# Patient Record
Sex: Female | Born: 1970 | Race: White | Hispanic: No | Smoking: Never smoker
Health system: Southern US, Community
[De-identification: ages and names within clinical notes are randomized; demographics above are authoritative.]

---

## 1999-03-11 ENCOUNTER — Other Ambulatory Visit: Admission: RE | Admit: 1999-03-11 | Discharge: 1999-03-11 | Payer: Self-pay | Admitting: Obstetrics and Gynecology

## 1999-08-01 ENCOUNTER — Inpatient Hospital Stay (HOSPITAL_COMMUNITY): Admission: AD | Admit: 1999-08-01 | Discharge: 1999-08-01 | Payer: Self-pay | Admitting: Obstetrics and Gynecology

## 1999-08-24 ENCOUNTER — Encounter: Payer: Self-pay | Admitting: Obstetrics and Gynecology

## 1999-08-24 ENCOUNTER — Inpatient Hospital Stay (HOSPITAL_COMMUNITY): Admission: AD | Admit: 1999-08-24 | Discharge: 1999-08-24 | Payer: Self-pay | Admitting: Obstetrics and Gynecology

## 1999-09-16 ENCOUNTER — Inpatient Hospital Stay (HOSPITAL_COMMUNITY): Admission: AD | Admit: 1999-09-16 | Discharge: 1999-09-16 | Payer: Self-pay | Admitting: Obstetrics and Gynecology

## 1999-09-26 ENCOUNTER — Inpatient Hospital Stay (HOSPITAL_COMMUNITY): Admission: AD | Admit: 1999-09-26 | Discharge: 1999-09-26 | Payer: Self-pay | Admitting: Obstetrics & Gynecology

## 1999-09-29 ENCOUNTER — Inpatient Hospital Stay (HOSPITAL_COMMUNITY): Admission: AD | Admit: 1999-09-29 | Discharge: 1999-09-29 | Payer: Self-pay | Admitting: *Deleted

## 1999-10-02 ENCOUNTER — Inpatient Hospital Stay (HOSPITAL_COMMUNITY): Admission: AD | Admit: 1999-10-02 | Discharge: 1999-10-03 | Payer: Self-pay | Admitting: Obstetrics and Gynecology

## 2001-04-10 ENCOUNTER — Encounter: Payer: Self-pay | Admitting: *Deleted

## 2001-04-10 ENCOUNTER — Emergency Department (HOSPITAL_COMMUNITY): Admission: EM | Admit: 2001-04-10 | Discharge: 2001-04-10 | Payer: Self-pay | Admitting: *Deleted

## 2001-04-20 ENCOUNTER — Emergency Department (HOSPITAL_COMMUNITY): Admission: EM | Admit: 2001-04-20 | Discharge: 2001-04-20 | Payer: Self-pay | Admitting: Emergency Medicine

## 2001-11-18 ENCOUNTER — Inpatient Hospital Stay (HOSPITAL_COMMUNITY): Admission: AD | Admit: 2001-11-18 | Discharge: 2001-11-18 | Payer: Self-pay | Admitting: Obstetrics and Gynecology

## 2001-12-28 ENCOUNTER — Inpatient Hospital Stay (HOSPITAL_COMMUNITY): Admission: AD | Admit: 2001-12-28 | Discharge: 2001-12-28 | Payer: Self-pay | Admitting: *Deleted

## 2001-12-30 ENCOUNTER — Inpatient Hospital Stay (HOSPITAL_COMMUNITY): Admission: AD | Admit: 2001-12-30 | Discharge: 2002-01-01 | Payer: Self-pay | Admitting: Obstetrics and Gynecology

## 2002-02-06 ENCOUNTER — Other Ambulatory Visit: Admission: RE | Admit: 2002-02-06 | Discharge: 2002-02-06 | Payer: Self-pay | Admitting: Obstetrics and Gynecology

## 2002-07-03 ENCOUNTER — Encounter: Payer: Self-pay | Admitting: Obstetrics and Gynecology

## 2002-07-03 ENCOUNTER — Inpatient Hospital Stay (HOSPITAL_COMMUNITY): Admission: AD | Admit: 2002-07-03 | Discharge: 2002-07-03 | Payer: Self-pay | Admitting: Obstetrics and Gynecology

## 2002-07-04 ENCOUNTER — Emergency Department (HOSPITAL_COMMUNITY): Admission: EM | Admit: 2002-07-04 | Discharge: 2002-07-04 | Payer: Self-pay | Admitting: Internal Medicine

## 2002-09-08 ENCOUNTER — Inpatient Hospital Stay (HOSPITAL_COMMUNITY): Admission: AD | Admit: 2002-09-08 | Discharge: 2002-09-08 | Payer: Self-pay | Admitting: Obstetrics and Gynecology

## 2002-09-16 ENCOUNTER — Encounter: Admission: RE | Admit: 2002-09-16 | Discharge: 2002-10-06 | Payer: Self-pay | Admitting: Obstetrics and Gynecology

## 2002-11-15 ENCOUNTER — Inpatient Hospital Stay (HOSPITAL_COMMUNITY): Admission: AD | Admit: 2002-11-15 | Discharge: 2002-11-17 | Payer: Self-pay | Admitting: Obstetrics and Gynecology

## 2002-12-24 ENCOUNTER — Other Ambulatory Visit: Admission: RE | Admit: 2002-12-24 | Discharge: 2002-12-24 | Payer: Self-pay | Admitting: Obstetrics and Gynecology

## 2003-05-12 ENCOUNTER — Ambulatory Visit (HOSPITAL_COMMUNITY): Admission: RE | Admit: 2003-05-12 | Discharge: 2003-05-12 | Payer: Self-pay | Admitting: Preventative Medicine

## 2003-06-25 ENCOUNTER — Emergency Department (HOSPITAL_COMMUNITY): Admission: EM | Admit: 2003-06-25 | Discharge: 2003-06-25 | Payer: Self-pay | Admitting: Emergency Medicine

## 2003-10-15 ENCOUNTER — Other Ambulatory Visit: Admission: RE | Admit: 2003-10-15 | Discharge: 2003-10-15 | Payer: Self-pay | Admitting: Obstetrics and Gynecology

## 2004-04-21 ENCOUNTER — Inpatient Hospital Stay (HOSPITAL_COMMUNITY): Admission: AD | Admit: 2004-04-21 | Discharge: 2004-04-23 | Payer: Self-pay | Admitting: Obstetrics and Gynecology

## 2004-09-28 ENCOUNTER — Emergency Department (HOSPITAL_COMMUNITY): Admission: EM | Admit: 2004-09-28 | Discharge: 2004-09-28 | Payer: Self-pay | Admitting: Emergency Medicine

## 2005-03-09 ENCOUNTER — Emergency Department (HOSPITAL_COMMUNITY): Admission: EM | Admit: 2005-03-09 | Discharge: 2005-03-09 | Payer: Self-pay | Admitting: Emergency Medicine

## 2008-01-20 ENCOUNTER — Emergency Department (HOSPITAL_COMMUNITY): Admission: EM | Admit: 2008-01-20 | Discharge: 2008-01-20 | Payer: Self-pay | Admitting: Emergency Medicine

## 2008-03-21 ENCOUNTER — Inpatient Hospital Stay (HOSPITAL_COMMUNITY): Admission: AD | Admit: 2008-03-21 | Discharge: 2008-03-21 | Payer: Self-pay | Admitting: Obstetrics

## 2008-05-10 ENCOUNTER — Inpatient Hospital Stay (HOSPITAL_COMMUNITY): Admission: AD | Admit: 2008-05-10 | Discharge: 2008-05-10 | Payer: Self-pay | Admitting: Obstetrics & Gynecology

## 2008-09-28 ENCOUNTER — Inpatient Hospital Stay (HOSPITAL_COMMUNITY): Admission: RE | Admit: 2008-09-28 | Discharge: 2008-09-30 | Payer: Self-pay | Admitting: Obstetrics and Gynecology

## 2009-01-29 ENCOUNTER — Emergency Department (HOSPITAL_COMMUNITY): Admission: EM | Admit: 2009-01-29 | Discharge: 2009-01-30 | Payer: Self-pay | Admitting: Emergency Medicine

## 2009-02-02 ENCOUNTER — Emergency Department (HOSPITAL_COMMUNITY): Admission: EM | Admit: 2009-02-02 | Discharge: 2009-02-02 | Payer: Self-pay | Admitting: Emergency Medicine

## 2009-02-16 ENCOUNTER — Emergency Department (HOSPITAL_COMMUNITY): Admission: EM | Admit: 2009-02-16 | Discharge: 2009-02-16 | Payer: Self-pay | Admitting: Emergency Medicine

## 2009-03-09 ENCOUNTER — Ambulatory Visit (HOSPITAL_COMMUNITY): Admission: RE | Admit: 2009-03-09 | Discharge: 2009-03-09 | Payer: Self-pay | Admitting: Obstetrics and Gynecology

## 2009-09-09 ENCOUNTER — Emergency Department (HOSPITAL_COMMUNITY): Admission: EM | Admit: 2009-09-09 | Discharge: 2009-09-09 | Payer: Self-pay | Admitting: Family Medicine

## 2010-03-25 ENCOUNTER — Encounter: Admission: RE | Admit: 2010-03-25 | Discharge: 2010-03-25 | Payer: Self-pay | Admitting: *Deleted

## 2010-08-24 LAB — POCT URINALYSIS DIP (DEVICE)
Glucose, UA: NEGATIVE mg/dL
Protein, ur: NEGATIVE mg/dL
Specific Gravity, Urine: 1.025 (ref 1.005–1.030)
Urobilinogen, UA: 1 mg/dL (ref 0.0–1.0)

## 2010-09-14 LAB — CBC
HCT: 29.9 % — ABNORMAL LOW (ref 36.0–46.0)
Hemoglobin: 10.4 g/dL — ABNORMAL LOW (ref 12.0–15.0)
MCV: 85.5 fL (ref 78.0–100.0)
Platelets: 165 10*3/uL (ref 150–400)
RBC: 3.51 MIL/uL — ABNORMAL LOW (ref 3.87–5.11)
RBC: 3.94 MIL/uL (ref 3.87–5.11)
RDW: 15 % (ref 11.5–15.5)
WBC: 11.6 10*3/uL — ABNORMAL HIGH (ref 4.0–10.5)

## 2010-09-14 LAB — RPR: RPR Ser Ql: NONREACTIVE

## 2010-10-18 NOTE — H&P (Signed)
[name], [name]                 ACCOUNT NO.:  [id number]   MEDICAL RECORD NO.:  [id number]          PATIENT TYPE:  INP   LOCATION:  9112                          FACILITY:  [facility]   PHYSICIAN:  Lenoard Aden, M.D.DATE OF BIRTH:  1970/11/05   DATE OF ADMISSION:  09/28/2008  DATE OF DISCHARGE:                              HISTORY & PHYSICAL   INDICATION FOR INDUCTION:  Advanced dilatation, history of precipitous  labor for control-attempted induction at 39 weeks.  She is a 40 year old  white female, G6, P5, who presents at 39 weeks for elective induction of  labor due to advanced dilatation and history of precipitous labor.   She is a nonsmoker, nondrinker.  She denies domestic physical violence.   She has no known drug allergies.   Medications include prenatal vitamins.   She has a history of 5 uncomplicated vaginal deliveries.   She has a family history of heart disease, hypertension, COPD, diabetes,  depression, and schizophrenia.   Social history is noncontributory.  Previous history of gestational  diabetes and pregnancy.   PHYSICAL EXAMINATION:  GENERAL:  She is a well-developed, well-nourished  white female in no acute distress.  HEENT:  Normal.  LUNGS:  Clear.  HEART:  Regular.  ABDOMEN:  Soft, gravid, nontender.  Estimated fetal weight 8 pounds.  Cervix is 4-5 cm vertex, 70%, -1.  EXTREMITIES:  No cords.  NEUROLOGIC:  Nonfocal.  SKIN:  Intact.   IMPRESSION:  1. 9-week obstetrics.  2. History of precipitous labor.   PLAN:  Proceed with control-attempted induction, epidural, anticipate  attempts at vaginal delivery.      Lenoard Aden, M.D.  Electronically Signed    RJT/MEDQ  D:  09/28/2008  T:  09/29/2008  Job:  [id number]

## 2010-10-21 NOTE — Consult Note (Signed)
   NAME:  [name], [name]                           ACCOUNT NO.:  [id number]   MEDICAL RECORD NO.:  [id number]                   PATIENT TYPE:  MAT   LOCATION:  [facility]                                 FACILITY:  [facility]   PHYSICIAN:  Lenoard Aden, M.D.             DATE OF BIRTH:  [birth year]   DATE OF CONSULTATION:  09/08/2002  DATE OF DISCHARGE:                                   CONSULTATION   EMERGENCY ROOM CONSULTATION   CHIEF COMPLAINT:  Rule out labor.   HISTORY OF PRESENT ILLNESS:  This patient is a 40 year old white female G5  P3; EDD of November 27, 2002; currently at 26 and three-sevenths weeks who  presents to rule out contractions.   MEDICATIONS:  Prenatal vitamins.   HISTORY:  Three uncomplicated term obstetric deliveries and one  uncomplicated TAB.  No other medical or surgical hospitalizations.  History  of preterm labor with term delivery x3.   FAMILY HISTORY:  Insulin-dependent diabetes, congestive heart failure,  hypertension.   PRENATAL LABORATORY DATA:  Reveals a blood type of O negative.  Rubella  immune.  Hepatitis B and HIV negative.   PHYSICAL EXAMINATION:  GENERAL:  The patient is a comfortable-appearing  white female in no apparent distress.  HEENT:  Normal.  LUNGS:  Clear.  HEART:  Regular rhythm.  ABDOMEN:  Soft, gravid, nontender.  PELVIC:  Cervix is closed, thick, and high.  EXTREMITIES AND NEUROLOGIC:  Nonfocal.   IMPRESSION:  Twenty-eight week intrauterine pregnancy, no evidence of  preterm labor.  NST reassuring.  Fetal heart tones in the 140 beat per  minute range, accelerations, no decelerations noted.   PLAN:  1. Discharge home.  2. Follow up in the office in one day.  3. Will do fetal fibronectin at next visit.                                               Lenoard Aden, M.D.    RJT/MEDQ  D:  09/08/2002  T:  09/08/2002  Job:  [id number]

## 2011-03-10 LAB — URINALYSIS, ROUTINE W REFLEX MICROSCOPIC
Bilirubin Urine: NEGATIVE
Glucose, UA: NEGATIVE mg/dL
Hgb urine dipstick: NEGATIVE
Specific Gravity, Urine: <1.005 — ABNORMAL LOW (ref 1.005–1.030)
Urobilinogen, UA: 0.2 mg/dL (ref 0.0–1.0)
pH: 6.5 (ref 5.0–8.0)

## 2014-10-19 ENCOUNTER — Other Ambulatory Visit: Payer: Self-pay

## 2014-10-19 DIAGNOSIS — Z1231 Encounter for screening mammogram for malignant neoplasm of breast: Secondary | ICD-10-CM

## 2014-10-22 ENCOUNTER — Ambulatory Visit
Admission: RE | Admit: 2014-10-22 | Discharge: 2014-10-22 | Disposition: A | Discharge status: Home / Self Care | Payer: 59 | Source: Ambulatory Visit

## 2014-10-22 DIAGNOSIS — Z1231 Encounter for screening mammogram for malignant neoplasm of breast: Secondary | ICD-10-CM

## 2014-11-16 ENCOUNTER — Emergency Department (INDEPENDENT_AMBULATORY_CARE_PROVIDER_SITE_OTHER): Payer: 59

## 2014-11-16 ENCOUNTER — Encounter (HOSPITAL_COMMUNITY): Payer: Self-pay | Admitting: Emergency Medicine

## 2014-11-16 ENCOUNTER — Emergency Department (INDEPENDENT_AMBULATORY_CARE_PROVIDER_SITE_OTHER)
Admission: EM | Admit: 2014-11-16 | Discharge: 2014-11-16 | Disposition: A | Discharge status: Home / Self Care | Payer: 59 | Source: Home / Self Care | Attending: Family Medicine | Admitting: Family Medicine

## 2014-11-16 DIAGNOSIS — M25531 Pain in right wrist: Secondary | ICD-10-CM

## 2014-11-16 NOTE — ED Provider Notes (Signed)
[name] is a 44 y.o. female who presents to Urgent Care today for right wrist pain. Patient was lifting a table from the back of her car when the table slid twisting her right wrist. She felt a pop and pain on the ulnar aspect of her right wrist. The pain has persisted today and is worse with activity. Aleve and a wrist wrap which helped a bit. She feels well otherwise with no radiating pain weakness or numbness. No fall or direct impact trauma.   History reviewed. No pertinent past medical history. History reviewed. No pertinent past surgical history. History  Substance Use Topics  . Smoking status: Never Smoker   . Smokeless tobacco: Not on file  . Alcohol Use: No   ROS as above Medications: No current facility-administered medications for this encounter.   No current outpatient prescriptions on file.   No Known Allergies   Exam:  BP 126/79 mmHg  Pulse 81  Temp(Src) 98.8 F (37.1 C) (Oral)  Resp 16  SpO2 95%  LMP 11/12/2014 Gen: Well NAD HEENT: EOMI,  MMM Right arm: Shoulder nontender normal motion Elbow normal-appearing nontender normal motion Wrist tender palpation along the ulnar wrist dorsal and volar. Pain with flexion and ulnar deviation of the wrist and extension and ulnar deviation of the wrist. It is nontender in the anatomical snuff box. Pulses capillary refill and sensation are intact distally Exts: Brisk capillary refill, warm and well perfused.   No results found for this or any previous visit (from the past 24 hour(s)). 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 Lateral right wrist pain. Initial encounter.  EXAM: RIGHT WRIST - COMPLETE 3+ VIEW  COMPARISON:  None.  FINDINGS: No acute bony or joint abnormality is identified. Mild ulnar minus variance is noted. Soft tissue structures are unremarkable.  IMPRESSION: No acute abnormality.   Electronically Signed   By: Drusilla Kanner M.D.   On: 11/16/2014 17:07    Assessment and Plan: 44 y.o. female with wrist pain and concern for strain or injury to the flexor carpi ulnaris tendon or injury to the TFCC.  Discussed options. Treat with thumb spica splint and NSAIDs. Patient declines prednisone dose pack. Follow-up with hand surgery if not getting better.  Discussed warning signs or symptoms. Please see discharge instructions. Patient expresses understanding.     Rodolph Bong, MD 11/16/14 1725

## 2014-11-16 NOTE — Discharge Instructions (Signed)
Thank you for coming in today. Take up to 2 Aleve twice daily for pain. Use the thumb spica splint. Follow-up with hand surgery if not getting better.  Wrist Sprain with Rehab A sprain is an injury in which a ligament that maintains the proper alignment of a joint is partially or completely torn. The ligaments of the wrist are susceptible to sprains. Sprains are classified into three categories. Grade 1 sprains cause pain, but the tendon is not lengthened. Grade 2 sprains include a lengthened ligament because the ligament is stretched or partially ruptured. With grade 2 sprains there is still function, although the function may be diminished. Grade 3 sprains are characterized by a complete tear of the tendon or muscle, and function is usually impaired. SYMPTOMS   Pain tenderness, inflammation, and/or bruising (contusion) of the injury.  A "pop" or tear felt and/or heard at the time of injury.  Decreased wrist function. CAUSES  A wrist sprain occurs when a force is placed on one or more ligaments that is greater than it/they can withstand. Common mechanisms of injury include:  Catching a ball with you hands.  Repetitive and/ or strenuous extension or flexion of the wrist. RISK INCREASES WITH:  Previous wrist injury.  Contact sports (boxing or wrestling).  Activities in which falling is common.  Poor strength and flexibility.  Improperly fitted or padded protective equipment. PREVENTION  Warm up and stretch properly before activity.  Allow for adequate recovery between workouts.  Maintain physical fitness:  Strength, flexibility, and endurance.  Cardiovascular fitness.  Protect the wrist joint by limiting its motion with the use of taping, braces, or splints.  Protect the wrist after injury for 6 to 12 months. PROGNOSIS  The prognosis for wrist sprains depends on the degree of injury. Grade 1 sprains require 2 to 6 weeks of treatment. Grade 2 sprains require 6 to 8 weeks  of treatment, and grade 3 sprains require up to 12 weeks.  RELATED COMPLICATIONS   Prolonged healing time, if improperly treated or re-injured.  Recurrent symptoms that result in a chronic problem.  Injury to nearby structures (bone, cartilage, nerves, or tendons).  Arthritis of the wrist.  Inability to compete in athletics at a high level.  Wrist stiffness or weakness.  Progression to a complete rupture of the ligament. TREATMENT  Treatment initially involves resting from any activities that aggravate the symptoms, and the use of ice and medications to help reduce pain and inflammation. Your caregiver may recommend immobilizing the wrist for a period of time in order to reduce stress on the ligament and allow for healing. After immobilization it is important to perform strengthening and stretching exercises to help regain strength and a full range of motion. These exercises may be completed at home or with a therapist. Surgery is not usually required for wrist sprains, unless the ligament has been ruptured (grade 3 sprain). MEDICATION   If pain medication is necessary, then nonsteroidal anti-inflammatory medications, such as aspirin and ibuprofen, or other minor pain relievers, such as acetaminophen, are often recommended.  Do not take pain medication for 7 days before surgery.  Prescription pain relievers may be given if deemed necessary by your caregiver. Use only as directed and only as much as you need. HEAT AND COLD  Cold treatment (icing) relieves pain and reduces inflammation. Cold treatment should be applied for 10 to 15 minutes every 2 to 3 hours for inflammation and pain and immediately after any activity that aggravates your symptoms. Use ice  packs or massage the area with a piece of ice (ice massage).  Heat treatment may be used prior to performing the stretching and strengthening activities prescribed by your caregiver, physical therapist, or athletic trainer. Use a heat  pack or soak your injury in warm water. SEEK MEDICAL CARE IF:  Treatment seems to offer no benefit, or the condition worsens.  Any medications produce adverse side effects. EXERCISES RANGE OF MOTION (ROM) AND STRETCHING EXERCISES - Wrist Sprain  These exercises may help you when beginning to rehabilitate your injury. Your symptoms may resolve with or without further involvement from your physician, physical therapist or athletic trainer. While completing these exercises, remember:   Restoring tissue flexibility helps normal motion to return to the joints. This allows healthier, less painful movement and activity.  An effective stretch should be held for at least 30 seconds.  A stretch should never be painful. You should only feel a gentle lengthening or release in the stretched tissue. RANGE OF MOTION - Wrist Flexion, Active-Assisted  Extend your right / left elbow with your fingers pointing down.*  Gently pull the back of your hand towards you until you feel a gentle stretch on the top of your forearm.  Hold this position for __________ seconds. Repeat __________ times. Complete this exercise __________ times per day.  *If directed by your physician, physical therapist or athletic trainer, complete this stretch with your elbow bent rather than extended. RANGE OF MOTION - Wrist Extension, Active-Assisted  Extend your right / left elbow and turn your palm upwards.*  Gently pull your palm/fingertips back so your wrist extends and your fingers point more toward the ground.  You should feel a gentle stretch on the inside of your forearm.  Hold this position for __________ seconds. Repeat __________ times. Complete this exercise __________ times per day. *If directed by your physician, physical therapist or athletic trainer, complete this stretch with your elbow bent, rather than extended. RANGE OF MOTION - Supination, Active  Stand or sit with your elbows at your side. Bend your  right / left elbow to 90 degrees.  Turn your palm upward until you feel a gentle stretch on the inside of your forearm.  Hold this position for __________ seconds. Slowly release and return to the starting position. Repeat __________ times. Complete this stretch __________ times per day.  RANGE OF MOTION - Pronation, Active  Stand or sit with your elbows at your side. Bend your right / left elbow to 90 degrees.  Turn your palm downward until you feel a gentle stretch on the top of your forearm.  Hold this position for __________ seconds. Slowly release and return to the starting position. Repeat __________ times. Complete this stretch __________ times per day.  STRETCH - Wrist Flexion  Place the back of your right / left hand on a tabletop leaving your elbow slightly bent. Your fingers should point away from your body.  Gently press the back of your hand down onto the table by straightening your elbow. You should feel a stretch on the top of your forearm.  Hold this position for __________ seconds. Repeat __________ times. Complete this stretch __________ times per day.  STRETCH - Wrist Extension  Place your right / left fingertips on a tabletop leaving your elbow slightly bent. Your fingers should point backwards.  Gently press your fingers and palm down onto the table by straightening your elbow. You should feel a stretch on the inside of your forearm.  Hold this position for  __________ seconds. Repeat __________ times. Complete this stretch __________ times per day.  STRENGTHENING EXERCISES - Wrist Sprain These exercises may help you when beginning to rehabilitate your injury. They may resolve your symptoms with or without further involvement from your physician, physical therapist or athletic trainer. While completing these exercises, remember:   Muscles can gain both the endurance and the strength needed for everyday activities through controlled exercises.  Complete these  exercises as instructed by your physician, physical therapist or athletic trainer. Progress with the resistance and repetition exercises only as your caregiver advises. STRENGTH - Wrist Flexors  Sit with your right / left forearm palm-up and fully supported. Your elbow should be resting below the height of your shoulder. Allow your wrist to extend over the edge of the surface.  Loosely holding a __________ weight or a piece of rubber exercise band/tubing, slowly curl your hand up toward your forearm.  Hold this position for __________ seconds. Slowly lower the wrist back to the starting position in a controlled manner. Repeat __________ times. Complete this exercise __________ times per day.  STRENGTH - Wrist Extensors  Sit with your right / left forearm palm-down and fully supported. Your elbow should be resting below the height of your shoulder. Allow your wrist to extend over the edge of the surface.  Loosely holding a __________ weight or a piece of rubber exercise band/tubing, slowly curl your hand up toward your forearm.  Hold this position for __________ seconds. Slowly lower the wrist back to the starting position in a controlled manner. Repeat __________ times. Complete this exercise __________ times per day.  STRENGTH - Ulnar Deviators  Stand with a ____________________ weight in your right / left hand, or sit holding on to the rubber exercise band/tubing with your opposite arm supported.  Move your wrist so that your pinkie travels toward your forearm and your thumb moves away from your forearm.  Hold this position for __________ seconds and then slowly lower the wrist back to the starting position. Repeat __________ times. Complete this exercise __________ times per day STRENGTH - Radial Deviators  Stand with a ____________________ weight in your  right / left hand, or sit holding on to the rubber exercise band/tubing with your arm supported.  Raise your hand upward in  front of you or pull up on the rubber tubing.  Hold this position for __________ seconds and then slowly lower the wrist back to the starting position. Repeat __________ times. Complete this exercise __________ times per day. STRENGTH - Forearm Supinators  Sit with your right / left forearm supported on a table, keeping your elbow below shoulder height. Rest your hand over the edge, palm down.  Gently grip a hammer or a soup ladle.  Without moving your elbow, slowly turn your palm and hand upward to a "thumbs-up" position.  Hold this position for __________ seconds. Slowly return to the starting position. Repeat __________ times. Complete this exercise __________ times per day.  STRENGTH - Forearm Pronators  Sit with your right / left forearm supported on a table, keeping your elbow below shoulder height. Rest your hand over the edge, palm up.  Gently grip a hammer or a soup ladle.  Without moving your elbow, slowly turn your palm and hand upward to a "thumbs-up" position.  Hold this position for __________ seconds. Slowly return to the starting position. Repeat __________ times. Complete this exercise __________ times per day.  STRENGTH - Grip  Grasp a tennis ball, a dense sponge, or  a large, rolled sock in your hand.  Squeeze as hard as you can without increasing any pain.  Hold this position for __________ seconds. Release your grip slowly. Repeat __________ times. Complete this exercise __________ times per day.  Document Released: 05/22/2005 Document Revised: 08/14/2011 Document Reviewed: 09/03/2008 [facility] Patient Information 2015 [employer], [place]. This information is not intended to replace advice given to you by your health care provider. Make sure you discuss any questions you have with your health care provider.

## 2014-11-16 NOTE — ED Notes (Signed)
Pt reports she inj/twisted her right wrist this morning while taking out some tables out of her car Pain increases w/activity Alert, no signs of acute distress.

## 2015-12-02 ENCOUNTER — Other Ambulatory Visit: Payer: Self-pay | Admitting: Physician Assistant

## 2015-12-02 DIAGNOSIS — Z1231 Encounter for screening mammogram for malignant neoplasm of breast: Secondary | ICD-10-CM

## 2015-12-08 ENCOUNTER — Ambulatory Visit: Payer: 59

## 2015-12-08 ENCOUNTER — Ambulatory Visit
Admission: RE | Admit: 2015-12-08 | Discharge: 2015-12-08 | Disposition: A | Discharge status: Home / Self Care | Payer: 59 | Source: Ambulatory Visit | Attending: Physician Assistant | Admitting: Physician Assistant

## 2015-12-08 DIAGNOSIS — Z1231 Encounter for screening mammogram for malignant neoplasm of breast: Secondary | ICD-10-CM

## 2016-08-09 IMAGING — DX DG WRIST COMPLETE 3+V*R*
4 series · 4 of 4 positions shown · non-contrast
Comparison: None.

CLINICAL DATA: Twisting injury right wrist today when loading and
unloading rental tables. Lateral right wrist pain. Initial
encounter.

EXAM:
RIGHT WRIST - COMPLETE 3+ VIEW

[wrist pa (1 of 2)]
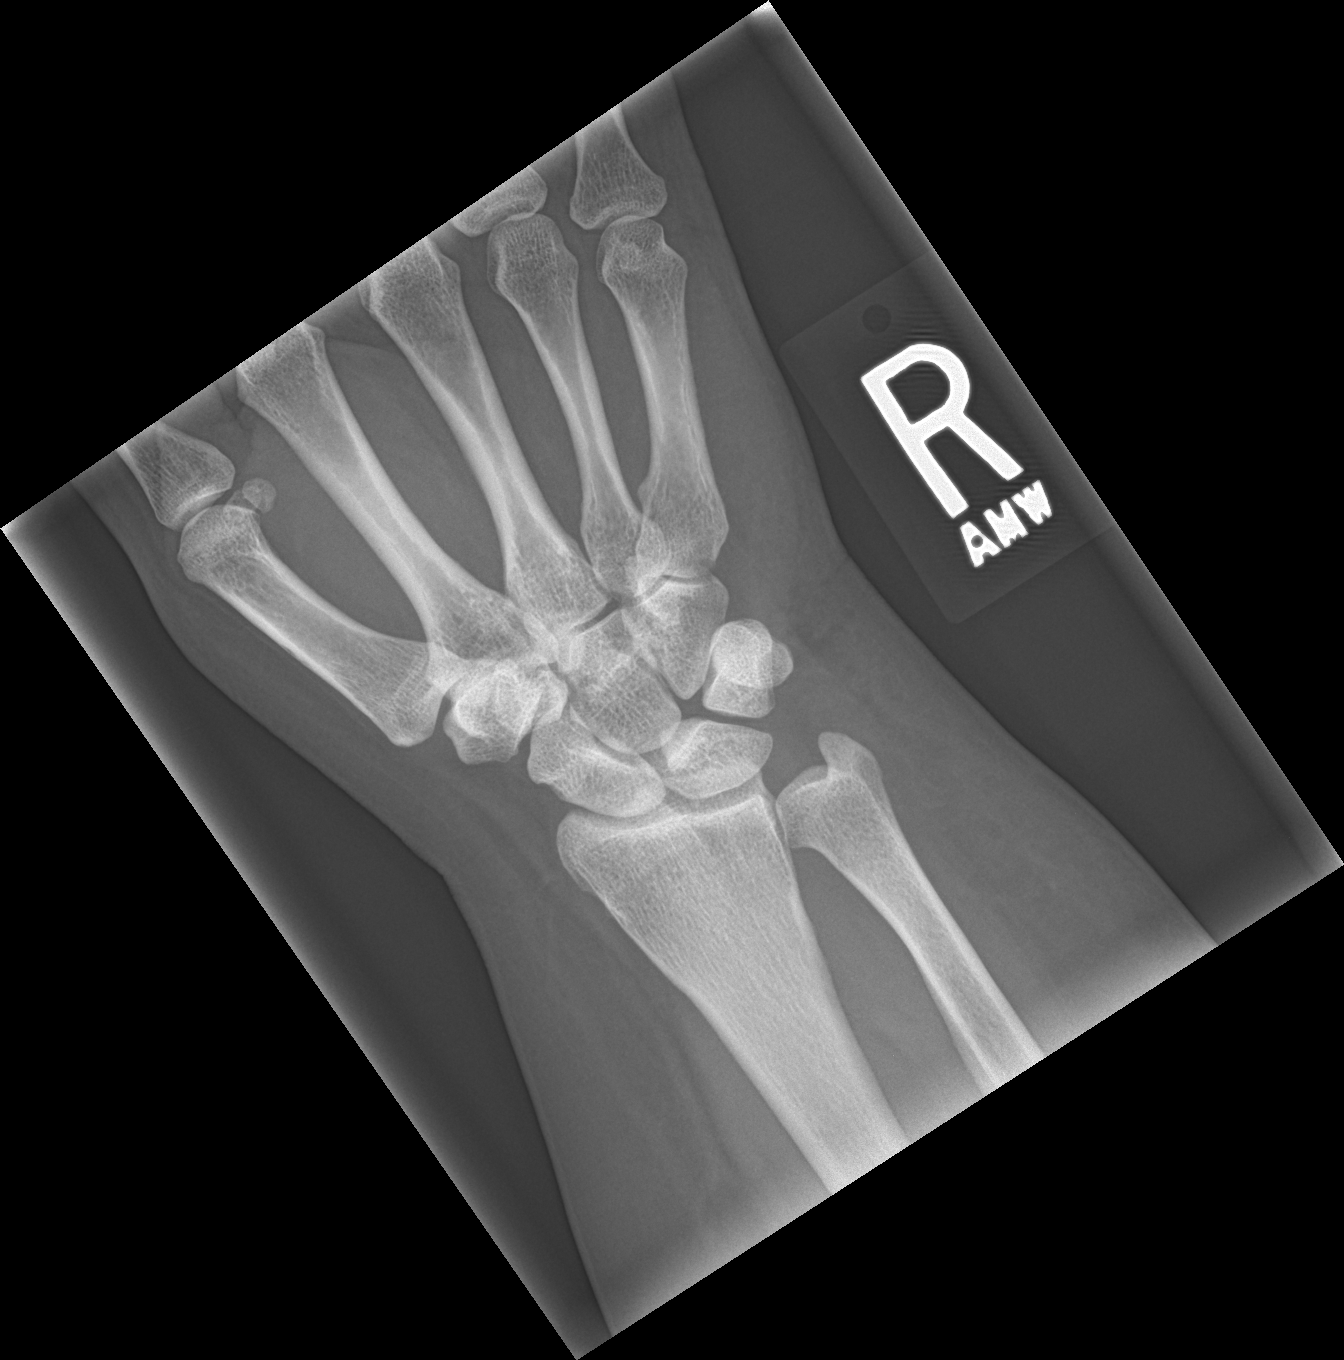

[wrist obl]
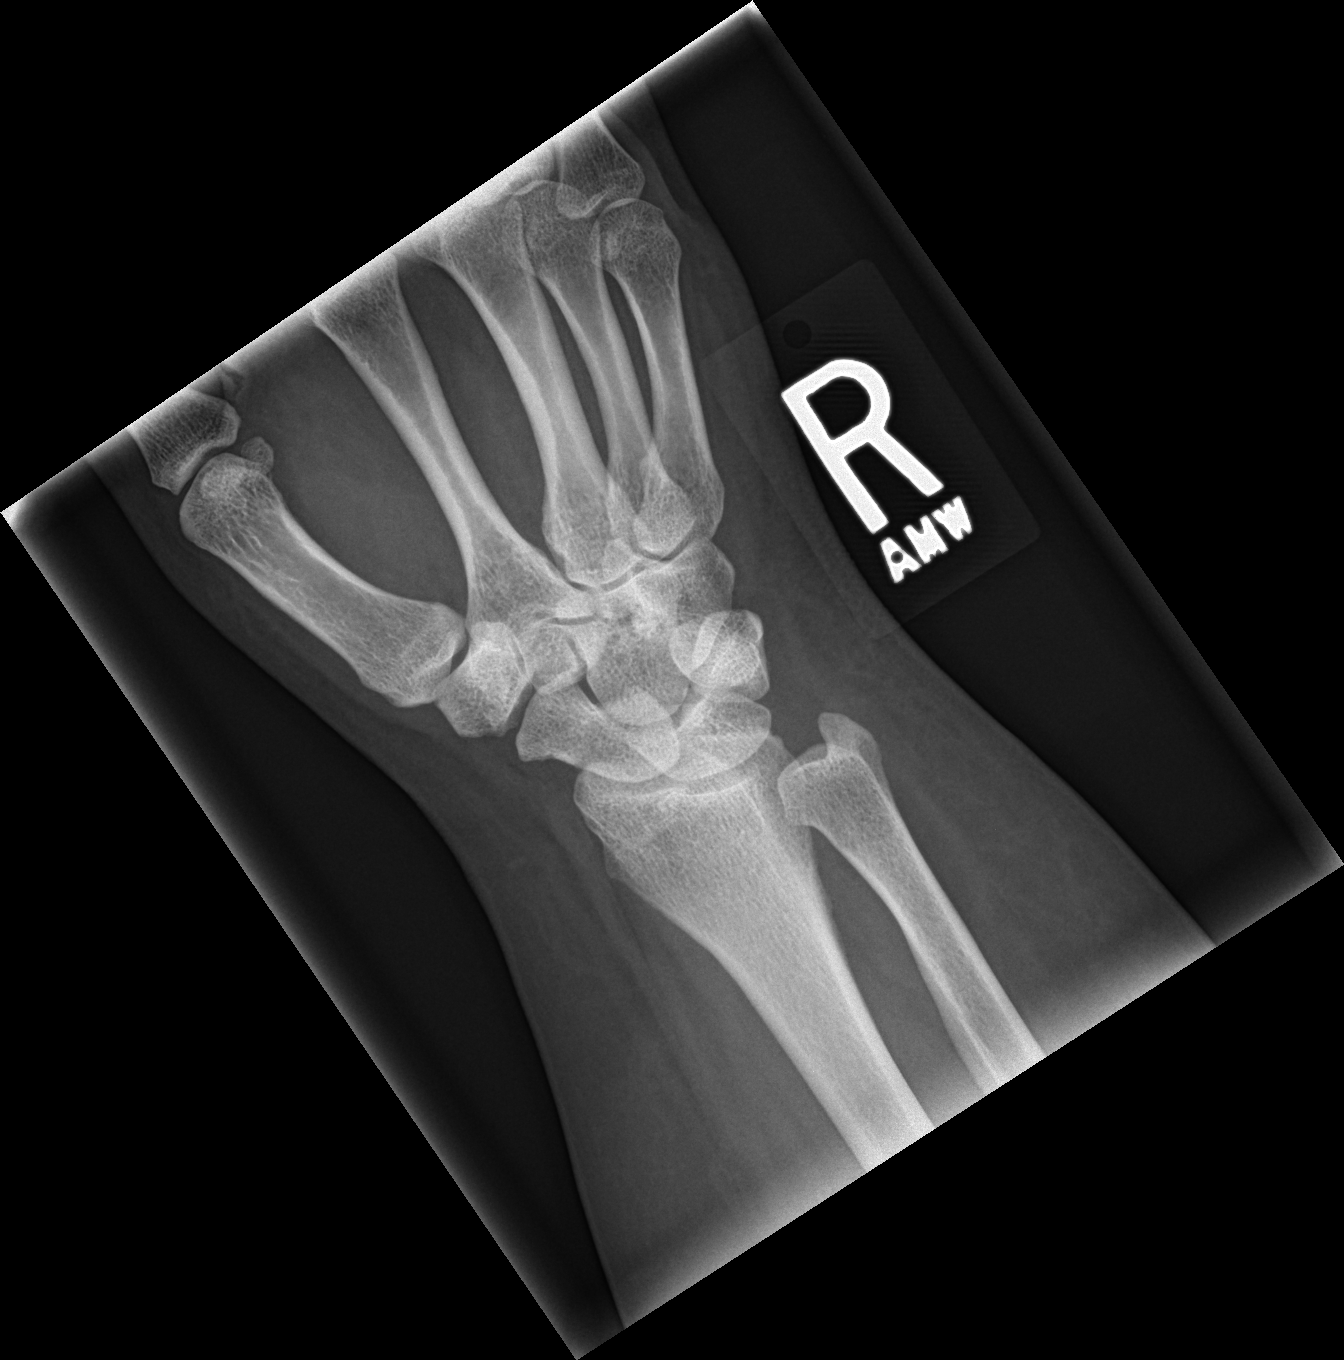

[wrist lat]
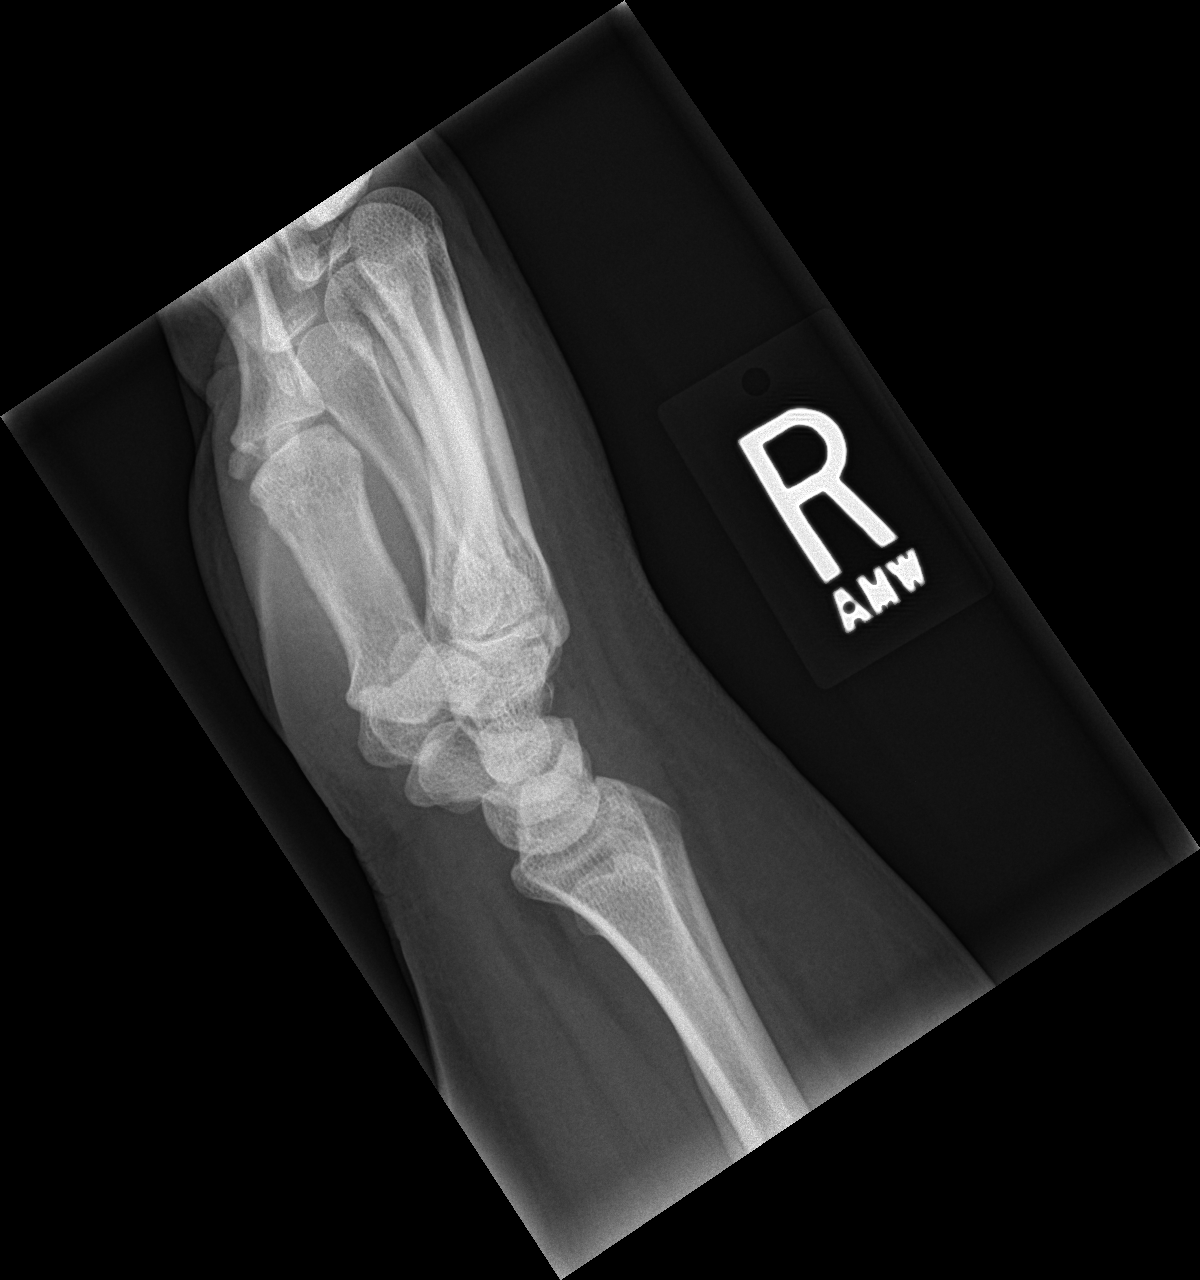

[wrist pa (2 of 2)]
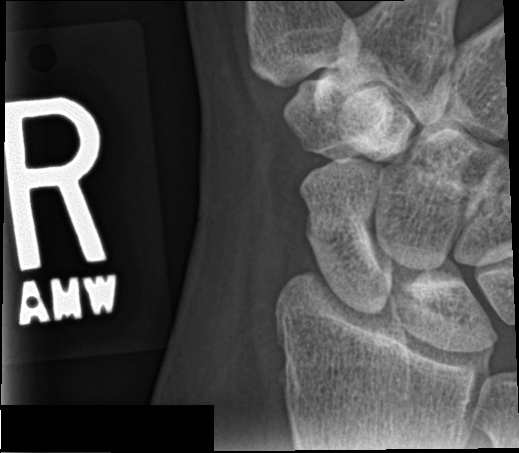

[4 of 4 positions shown; findings below may reference images not displayed]

FINDINGS: No acute bony or joint abnormality is identified. Mild ulnar minus
variance is noted. Soft tissue structures are unremarkable.
IMPRESSION: No acute abnormality.

## 2023-07-20 ENCOUNTER — Telehealth: Payer: Self-pay | Admitting: Dietician

## 2023-07-20 NOTE — Telephone Encounter (Signed)
Error
